# Patient Record
Sex: Male | Born: 1955 | Race: Black or African American | Hispanic: No | Marital: Married | Smoking: Current every day smoker
Health system: Southern US, Community
[De-identification: ages and names within clinical notes are randomized; demographics above are authoritative.]

## PROBLEM LIST (undated history)

## (undated) DIAGNOSIS — R6884 Jaw pain: Secondary | ICD-10-CM

---

## 1998-06-13 ENCOUNTER — Emergency Department (HOSPITAL_COMMUNITY): Admission: EM | Admit: 1998-06-13 | Discharge: 1998-06-13 | Payer: Self-pay | Admitting: Emergency Medicine

## 1998-09-03 ENCOUNTER — Emergency Department (HOSPITAL_COMMUNITY): Admission: EM | Admit: 1998-09-03 | Discharge: 1998-09-03 | Payer: Self-pay | Admitting: Emergency Medicine

## 1999-07-04 ENCOUNTER — Encounter: Payer: Self-pay | Admitting: Emergency Medicine

## 1999-07-04 ENCOUNTER — Emergency Department (HOSPITAL_COMMUNITY): Admission: EM | Admit: 1999-07-04 | Discharge: 1999-07-04 | Payer: Self-pay | Admitting: Emergency Medicine

## 2000-09-09 ENCOUNTER — Emergency Department (HOSPITAL_COMMUNITY): Admission: EM | Admit: 2000-09-09 | Discharge: 2000-09-09 | Payer: Self-pay | Admitting: Emergency Medicine

## 2000-11-13 ENCOUNTER — Emergency Department (HOSPITAL_COMMUNITY): Admission: EM | Admit: 2000-11-13 | Discharge: 2000-11-13 | Payer: Self-pay | Admitting: Emergency Medicine

## 2004-10-13 ENCOUNTER — Inpatient Hospital Stay (HOSPITAL_COMMUNITY): Admission: AC | Admit: 2004-10-13 | Discharge: 2004-10-20 | Payer: Self-pay | Admitting: Emergency Medicine

## 2010-04-22 ENCOUNTER — Emergency Department (HOSPITAL_COMMUNITY): Admission: EM | Admit: 2010-04-22 | Discharge: 2010-04-22 | Payer: Self-pay | Admitting: Emergency Medicine

## 2010-04-27 ENCOUNTER — Emergency Department (HOSPITAL_COMMUNITY): Admission: EM | Admit: 2010-04-27 | Discharge: 2010-04-28 | Payer: Self-pay | Admitting: Emergency Medicine

## 2010-09-12 ENCOUNTER — Emergency Department (HOSPITAL_COMMUNITY)
Admission: EM | Admit: 2010-09-12 | Discharge: 2010-09-12 | Payer: Self-pay | Source: Home / Self Care | Admitting: Emergency Medicine
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
## 2012-01-09 ENCOUNTER — Emergency Department (HOSPITAL_COMMUNITY)
Admission: EM | Admit: 2012-01-09 | Discharge: 2012-01-09 | Disposition: A | Discharge status: Home / Self Care | Payer: Self-pay | Attending: Emergency Medicine | Admitting: Emergency Medicine

## 2012-01-09 ENCOUNTER — Encounter (HOSPITAL_COMMUNITY): Payer: Self-pay | Admitting: Emergency Medicine

## 2012-01-09 DIAGNOSIS — K0889 Other specified disorders of teeth and supporting structures: Secondary | ICD-10-CM

## 2012-01-09 DIAGNOSIS — K029 Dental caries, unspecified: Secondary | ICD-10-CM | POA: Insufficient documentation

## 2012-01-09 DIAGNOSIS — F17200 Nicotine dependence, unspecified, uncomplicated: Secondary | ICD-10-CM | POA: Insufficient documentation

## 2012-01-09 MED ORDER — OXYCODONE-ACETAMINOPHEN 5-325 MG PO TABS
1.0000 | ORAL_TABLET | Freq: Once | ORAL | Status: AC
  Administered 2012-01-09: 1 via ORAL
  Filled 2012-01-09: qty 1

## 2012-01-09 MED ORDER — BUPIVACAINE-EPINEPHRINE PF 0.5-1:200000 % IJ SOLN
1.8000 mL | Freq: Once | INTRAMUSCULAR | Status: DC
  Filled 2012-01-09: qty 1.8

## 2012-01-09 MED ORDER — LIDOCAINE HCL 2 % IJ SOLN
20.0000 mL | Freq: Once | INTRAMUSCULAR | Status: DC
  Filled 2012-01-09: qty 1

## 2012-01-09 MED ORDER — PENICILLIN V POTASSIUM 500 MG PO TABS: 500.0000 mg | ORAL_TABLET | Freq: Three times a day (TID) | ORAL | Status: AC

## 2012-01-09 MED ORDER — HYDROCODONE-ACETAMINOPHEN 5-325 MG PO TABS: ORAL_TABLET | ORAL | Status: AC

## 2012-01-09 NOTE — ED Provider Notes (Signed)
History     CSN: [id number]  Arrival date & time 01/09/12  1307   First MD Initiated Contact with Patient 01/09/12 1324      Chief Complaint  Patient presents with  . Dental Pain    3 day hx of pain on l/side of face,    (Consider location/radiation/quality/duration/timing/severity/associated sxs/prior treatment) HPI Comments: Patient with L maxillary tooth pain with radiation to L ear x 3 days. No fever, facial swelling, SOB. Tried anbesol without relief. History of poor dentition with tooth pain. Nothing makes pain better. Palpation makes it worse.   Patient is a 56 y.o. male presenting with tooth pain. The history is provided by the patient.  Dental PainThe primary symptoms include mouth pain and headaches. Primary symptoms do not include dental injury, fever, shortness of breath or sore throat. The symptoms began 3 to 5 days ago. The symptoms are unchanged.  Additional symptoms include: ear pain. Additional symptoms do not include: gum swelling, gum tenderness, facial swelling and trouble swallowing.    History reviewed. No pertinent past medical history.  History reviewed. No pertinent past surgical history.  History reviewed. No pertinent family history.  History  Substance Use Topics  . Smoking status: Current Everyday Smoker  . Smokeless tobacco: Not on file  . Alcohol Use: No      Review of Systems  Constitutional: Negative for fever.  HENT: Positive for ear pain and dental problem. Negative for sore throat, facial swelling, trouble swallowing and neck pain.   Respiratory: Negative for shortness of breath and stridor.   Skin: Negative for color change.  Neurological: Positive for headaches.    Allergies  Review of patient's allergies indicates no known allergies.  Home Medications  No current outpatient prescriptions on file.  BP 115/68  Pulse 66  Temp(Src) 99 F (37.2 C) (Oral)  Resp 16  SpO2 99%  Physical Exam  Nursing note and vitals  reviewed. Constitutional: He is oriented to person, place, and time. He appears well-developed and well-nourished.  HENT:  Head: Normocephalic and atraumatic. No trismus in the jaw.  Right Ear: Tympanic membrane, external ear and ear canal normal.  Left Ear: Tympanic membrane, external ear and ear canal normal.  Nose: Nose normal.  Mouth/Throat: Uvula is midline, oropharynx is clear and moist and mucous membranes are normal. Abnormal dentition. Dental caries present. No dental abscesses or uvula swelling. No tonsillar abscesses.       Patient with L maxillary tooth pain and tenderness to palpation in area of first bicuspid. No swelling or erythema noted on exam.  Eyes: Pupils are equal, round, and reactive to light.  Neck: Normal range of motion. Neck supple.       No neck swelling or Lugwig's angina  Neurological: He is alert and oriented to person, place, and time.  Skin: Skin is warm and dry.  Psychiatric: He has a normal mood and affect.    ED Course  Procedures (including critical care time)  Labs Reviewed - No data to display No results found.   1. Pain, dental     2:06 PM Patient seen and examined. Will dental block.  Vital signs reviewed and are as follows: Filed Vitals:   01/09/12 1340  BP: 115/68  Pulse: 66  Temp: 99 F (37.2 C)  Resp: 16   Dental block was performed. 1.53mL of 2% lidocaine with epi was combined with 1.25mL 0.5% bupivacaine with epi and an alveolar block was performed. Injections made at base of tooth as  well as the tooth immediately posterior and anterior to tooth. Adequate anesthesia was obtained with residual referred pain to posterior jaw. Minimal bleeding after injections. Patient tolerated procedure well with no immediate complications.   Patient counseled to take prescribed medications as directed, return with worsening facial or neck swelling, and to follow-up with his dentist as soon as possible.   Patient counseled on use of narcotic pain  medications. Counseled not to combine these medications with others containing tylenol. Urged not to drink alcohol, drive, or perform any other activities that requires focus while taking these medications. The patient verbalizes understanding and agrees with the plan.   MDM  Patient with toothache.  No gross abscess.  Exam unconcerning for Ludwig's angina or other deep tissue infection in neck.  Will treat with penicillin and pain medicine.  Urged patient to follow-up with dentist.         Renne Crigler, PA 01/09/12 1550

## 2012-01-09 NOTE — ED Notes (Signed)
toth pain on l/side of face x 3 days

## 2012-01-09 NOTE — Discharge Instructions (Signed)
Please read and follow all provided instructions.  Your diagnoses today include:  1. Pain, dental     Tests performed today include:  Vital signs. See below for your results today.   Medications prescribed:   Vicodin (hydrocodone/acetaminophen) - narcotic pain medication  You have been prescribed narcotic pain medication such as Vicodin, Percocet, or Ultram: DO NOT drive or perform any activities that require you to be awake and alert because this medicine can make you drowsy. BE VERY CAREFUL not to take multiple medicines containing Tylenol (also called acetaminophen). Doing so can lead to an overdose which can damage your liver and cause liver failure and possibly death.    Naproxen - anti-inflammatory pain medication  Do not exceed 500mg  naproxen every 12 hours  You have been prescribed an anti-inflammatory medication or NSAID. Take with food. Take smallest effective dose for the shortest duration needed for your pain. Stop taking if you experience stomach pain or vomiting.    Penicillin - antibiotic for dental infection  You have been prescribed an antibiotic medicine: take the entire course of medicine even if you are feeling better. Stopping early can cause the antibiotic not to work.  Take any prescribed medications only as directed.  Home care instructions:  Follow any educational materials contained in this packet.  Follow-up instructions: Please follow-up with your dentist for further evaluation of your symptoms. If you do not have a dentist or primary care doctor -- see below for referral information.   The exam and treatment you received today has been provided on an emergency basis only. This is not a substitute for complete medical or dental care. If your problem worsens or new symptoms (problems) appear, and you are unable to arrange prompt follow-up care with your dentist, return to this location.  Return instructions:   Please return to the Emergency  Department if you experience worsening symptoms.  Please return if you develop a fever, you develop more swelling in your face or neck, you have trouble breathing or swallowing food.  Please return if you have any other emergent concerns.  Additional Information:  Your vital signs today were: BP 115/68  Pulse 66  Temp(Src) 99 F (37.2 C) (Oral)  Resp 16  SpO2 99% If your blood pressure (BP) was elevated above 135/85 this visit, please have this repeated by your doctor within one month. -------------- No Primary Care Doctor Call [facility]  [phone] Other agencies that provide inexpensive medical care    [facility]  [phone]    [facility]  [phone]    [facility]  [phone]    [facility]  [phone]    [facility]  [phone]    [facility]  [phone] -------------- RESOURCE GUIDE:  Dental Problems  Patients with Medicaid: [facility] [phone]                                            [phone] [address] Phone:  [phone]  Phone:  [phone]  If unable to pay or uninsured, contact:  [facility] or [facility]. to become qualified for the adult dental clinic.  Chronic Pain Problems Contact [facility]  [phone] Patients need to be referred by their primary care doctor.  Insufficient Money for Medicine Contact United Way:  call "211" or [facility] [phone].  Psychological Services [facility] Behavioral Health  [phone] [facility]  [phone] [facility] Mental Health   [phone] (emergency services [phone])  Substance Abuse Resources [facility] and Drug Services  [phone] [facility] [phone] [facility] [place] [phone] Floydene Flock [phone] Residential & Outpatient Substance Abuse Program   [phone]  Abuse/Neglect [facility] Child Abuse Hotline 878-075-1589 [facility] Child Abuse Hotline (501)284-9788 (After Hours)  Emergency Shelter [facility] [phone]  Maternity Homes Room at the [place] [facility] [place] [phone] [place] Services [phone]  [facility] Resources  [facility] [place]     [employer]                          [facility] [address] [place]                       [address]      [address]  Blondell Reveal Phone:  [phone]                                   Phone:  [phone]                 Phone:  [phone]  [facility] Mental Health Phone:  [phone]  [facility] Child Abuse Hotline 228-583-7065 [phone] (After Hours)

## 2012-01-09 NOTE — ED Provider Notes (Signed)
Medical screening examination/treatment/procedure(s) were performed by non-physician practitioner and as supervising physician I was immediately available for consultation/collaboration.    Celene Kras, MD 01/09/12 [phone]

## 2013-10-15 ENCOUNTER — Emergency Department (HOSPITAL_COMMUNITY)
Admission: EM | Admit: 2013-10-15 | Discharge: 2013-10-15 | Disposition: A | Discharge status: Home / Self Care | Payer: Self-pay | Attending: Emergency Medicine | Admitting: Emergency Medicine

## 2013-10-15 ENCOUNTER — Emergency Department (HOSPITAL_COMMUNITY): Payer: Self-pay

## 2013-10-15 ENCOUNTER — Encounter (HOSPITAL_COMMUNITY): Payer: Self-pay | Admitting: Emergency Medicine

## 2013-10-15 DIAGNOSIS — F17200 Nicotine dependence, unspecified, uncomplicated: Secondary | ICD-10-CM | POA: Insufficient documentation

## 2013-10-15 DIAGNOSIS — K089 Disorder of teeth and supporting structures, unspecified: Secondary | ICD-10-CM | POA: Insufficient documentation

## 2013-10-15 DIAGNOSIS — K0889 Other specified disorders of teeth and supporting structures: Secondary | ICD-10-CM

## 2013-10-15 MED ORDER — AMOXICILLIN-POT CLAVULANATE 875-125 MG PO TABS: 1.0000 | ORAL_TABLET | Freq: Two times a day (BID) | ORAL | Status: DC

## 2013-10-15 MED ORDER — OXYCODONE-ACETAMINOPHEN 10-325 MG PO TABS
0.5000 | ORAL_TABLET | ORAL | Status: AC | PRN
Start: 2013-10-15 — End: ?

## 2013-10-15 NOTE — ED Provider Notes (Signed)
CSN: [id number]     Arrival date & time 10/15/13  1001 History   First MD Initiated Contact with Patient 10/15/13 1029     Chief Complaint  Patient presents with  . Dental Pain   (Consider location/radiation/quality/duration/timing/severity/associated sxs/prior Treatment) Patient is a 58 y.o. male presenting with tooth pain. The history is provided by the patient and medical records. No language interpreter was used.  Dental Pain Location:  Upper Upper teeth location:  10/LU lateral incisor and 11/LU cuspid Quality:  Throbbing, pulsating, pressure-like, constant and aching Severity:  Severe Duration: acutely worsening last night. Timing:  Constant Progression:  Worsening Chronicity:  Recurrent Context: crown fracture and dental caries   Relieved by:  Nothing Worsened by:  Cold food/drink, hot food/drink and jaw movement Ineffective treatments: amoxicillin/ percocet/ hydrocodone. Associated symptoms: facial pain and gum swelling   Associated symptoms: no difficulty swallowing, no drooling, no facial swelling, no fever, no headaches and no neck pain   Risk factors: smoking     History reviewed. No pertinent past medical history. No past surgical history on file. No family history on file. History  Substance Use Topics  . Smoking status: Current Every Day Smoker  . Smokeless tobacco: Not on file  . Alcohol Use: No    Review of Systems  Constitutional: Negative for fever.  HENT: Negative for drooling and facial swelling.   Musculoskeletal: Negative for neck pain.  Neurological: Negative for headaches.    Allergies  Review of patient's allergies indicates no known allergies.  Home Medications   Current Outpatient Rx  Name  Route  Sig  Dispense  Refill  . amoxicillin (AMOXIL) 500 MG capsule   Oral   Take 500 mg by mouth 3 (three) times daily. 10 day supply started on 10/07/2013         . benzocaine (ORAJEL) 10 % mucosal gel   Mouth/Throat   Use as directed 1  application in the mouth or throat 3 (three) times daily as needed for mouth pain.         Marland Kitchen. HYDROcodone-acetaminophen (NORCO/VICODIN) 5-325 MG per tablet   Oral   Take 1 tablet by mouth every 6 (six) hours as needed for moderate pain.         Marland Kitchen. oxyCODONE-acetaminophen (PERCOCET/ROXICET) 5-325 MG per tablet   Oral   Take 1 tablet by mouth every 4 (four) hours as needed for moderate pain or severe pain.          BP 119/72  Pulse 66  Temp(Src) 97.5 F (36.4 C) (Oral)  Resp 17  SpO2 99% Physical Exam  Nursing note and vitals reviewed. Constitutional: He appears well-developed and well-nourished. No distress.  HENT:  Head: Normocephalic and atraumatic.  Mouth/Throat:    Eyes: Conjunctivae are normal. No scleral icterus.  Neck: Normal range of motion. Neck supple.  Cardiovascular: Normal rate, regular rhythm and normal heart sounds.   Pulmonary/Chest: Effort normal and breath sounds normal. No respiratory distress.  Abdominal: Soft. There is no tenderness.  Musculoskeletal: He exhibits no edema.  Neurological: He is alert.  Skin: Skin is warm and dry. He is not diaphoretic.  Psychiatric: His behavior is normal.    ED Course  Procedures (including critical care time) Labs Review Labs Reviewed - No data to display Imaging Review Dg Orthopantogram  10/15/2013   CLINICAL DATA:  Left mandible pain, history of left upper molar extraction  EXAM: ORTHOPANTOGRAM/PANORAMIC  COMPARISON:  Cervical spine radiographs 09/12/2010  FINDINGS: Lucent defect in the left  maxilla likely represents residual cavity from recent molar extraction as described. There are multiple missing and carious teeth. The mandible appears intact.  IMPRESSION: Lucent defect in the left maxilla, likely at the site of recent molar extraction. If there is clinical concern for odontogenic abscess, recommend CT scan of the face with contrast.   Electronically Signed   By: Malachy Moan M.D.   On: 10/15/2013 11:16      EKG Interpretation   None       MDM   1. Pain, dental    Patient with recetn dental extractions  Pain worsening since last night. Will switch to augmentin  Patient with toothache.  No gross abscess.  Exam unconcerning for Ludwig's angina or spread of infection.  Will treat with penicillin and pain medicine.  Urged patient to follow-up with dentist.       Arthor Captain, PA-C 10/16/13 [phone]

## 2013-10-15 NOTE — ED Notes (Signed)
Pt c/o dental pain.  States that he had 2 teeth pulled and it still hurts.  C/o pain x 2 wks.

## 2013-10-15 NOTE — Discharge Instructions (Signed)
You have a dental pain. Use the resource guide listed below to help you find a dentist if you do not already have one to followup with. It is very important that you get evaluated by a dentist as soon as possible. Call tomorrow to schedule an appointment. Use your pain medication as prescribed and do not operate heavy machinery while on pain medication. Note that your pain medication contains acetaminophen (Tylenol) & its is not reccommended that you use additional acetaminophen (Tylenol) while taking this medication. Take your full course of antibiotics. Read the instructions below.  Eat a soft or liquid diet and rinse your mouth out after meals with warm water. You should see a dentist or return here at once if you have increased swelling, increased pain or uncontrolled bleeding from the site of your injury.   SEEK MEDICAL CARE IF:   You have increased pain not controlled with medicines.   You have swelling around your tooth, in your face or neck.   You have bleeding which starts, continues, or gets worse.   You have a fever >101  If you are unable to open your mouth  RESOURCE GUIDE  Dental Problems  Patients with Medicaid: [facility]                     [facility] [id number]                                           [phone] Phone:  [phone]                                                  Phone:  [phone]  If unable to pay or uninsured, contact:  [facility] or [facility] to become qualified for the adult dental clinic.  Chronic Pain Problems Contact [facility]  [phone] Patients need to be referred by their primary care doctor.  Insufficient Money for Medicine Contact United Way:  call "211" or [facility] [phone].  No Primary Care Doctor Call [facility]  [phone] Other agencies that provide inexpensive medical care    [facility]  [phone]    [facility]  Internal Medicine  [phone]    [facility]  [phone]    [facility]  [phone]    [facility]  [phone]    [facility]  [phone]  Psychological Services [facility]  [phone] [facility]  [phone] [facility]   [phone] (emergency services [phone])  Substance Abuse Resources Alcohol and Drug Services  [phone] [facility] [phone] [facility] [phone] [facility] [phone] Residential & Outpatient Substance Abuse Program  [phone]  Abuse/Neglect [facility] Child Abuse Hotline 8137032846(336) [phone] [facility] Hotline (862)439-0947([phone] (After Hours)  Emergency Shelter [facility] [phone]  Maternity Homes Room at the [place] [facility] [place] [phone] [facility] [phone]  MRSA Hotline #:   502-538-41835146503379    [facility] Resources  [facility] [place]     United Way  [facility] °[address] [place]                       [address]      [address]  °[place]                                                [place]                            [place] °Phone:  [phone]                                   Phone:  [phone]                 Phone:  [phone] ° °[facility] °Phone:  342-8316 ° °[place] Child Abuse Hotline °(336) 342-1394 °([phone]) [phone] (After Hours) ° ° ° ° °

## 2013-10-16 NOTE — ED Provider Notes (Signed)
Medical screening examination/treatment/procedure(s) were performed by non-physician practitioner and as supervising physician I was immediately available for consultation/collaboration.  EKG Interpretation   None         Jaidy Cottam, MD 10/16/13 2133 

## 2015-06-18 ENCOUNTER — Emergency Department (HOSPITAL_COMMUNITY)
Admission: EM | Admit: 2015-06-18 | Discharge: 2015-06-18 | Disposition: A | Discharge status: Home / Self Care | Payer: No Typology Code available for payment source | Attending: Emergency Medicine | Admitting: Emergency Medicine

## 2015-06-18 ENCOUNTER — Encounter (HOSPITAL_COMMUNITY): Payer: Self-pay | Admitting: Emergency Medicine

## 2015-06-18 DIAGNOSIS — K088 Other specified disorders of teeth and supporting structures: Secondary | ICD-10-CM | POA: Diagnosis not present

## 2015-06-18 DIAGNOSIS — Z720 Tobacco use: Secondary | ICD-10-CM | POA: Insufficient documentation

## 2015-06-18 DIAGNOSIS — K0889 Other specified disorders of teeth and supporting structures: Secondary | ICD-10-CM

## 2015-06-18 DIAGNOSIS — R220 Localized swelling, mass and lump, head: Secondary | ICD-10-CM | POA: Diagnosis present

## 2015-06-18 MED ORDER — IBUPROFEN 600 MG PO TABS: 600.0000 mg | ORAL_TABLET | Freq: Four times a day (QID) | ORAL | Status: DC | PRN

## 2015-06-18 MED ORDER — BUPIVACAINE-EPINEPHRINE (PF) 0.5% -1:200000 IJ SOLN
1.8000 mL | Freq: Once | INTRAMUSCULAR | Status: AC
  Administered 2015-06-18: 1.8 mL
  Filled 2015-06-18: qty 1.8

## 2015-06-18 MED ORDER — PENICILLIN V POTASSIUM 500 MG PO TABS: 500.0000 mg | ORAL_TABLET | Freq: Four times a day (QID) | ORAL | Status: AC

## 2015-06-18 NOTE — ED Notes (Signed)
Pt reports pain, tenderness and swelling in r/lower gum line. Pain started 3 days ago. Swelling started yesterday.

## 2015-06-18 NOTE — ED Provider Notes (Signed)
CSN: [id number]     Arrival date & time 06/18/15  1150 History  This chart was scribed for non-physician practitioner, Joycie Peek, PA-C working with Linwood Dibbles, MD by Placido Sou, ED scribe. This patient was seen in room WTR9/WTR9 and the patient's care was started at 12:38 PM.   Chief Complaint  Patient presents with  . Oral Swelling    pain in r/lower gum line x 3 days. Swelling x 1 day   The history is provided by the patient. No language interpreter was used.    HPI Comments: Logan Payne is a 59 y.o. male who presents to the Emergency Department complaining of constant, moderate, right lower dental pain and swelling with onset 2 days ago. Pt notes having a loose tooth in the affected region and describes his pain as an 8/10 throbbing sensation. He notes taking 1x aspirin last night which provided no relief. Pt denies having any dental coverage or a primary dental provider. He denies any trismus, fevers, chills, trouble swallowing, abd pain or sore throat.   History reviewed. No pertinent past medical history. History reviewed. No pertinent past surgical history. Family History  Problem Relation Age of Onset  . Diabetes Mother    Social History  Substance Use Topics  . Smoking status: Current Every Day Smoker    Types: Cigarettes  . Smokeless tobacco: None  . Alcohol Use: Yes    Review of Systems  Constitutional: Negative for fever and chills.  HENT: Positive for dental problem and facial swelling. Negative for sore throat and trouble swallowing.   Gastrointestinal: Negative for abdominal pain.  All other systems reviewed and are negative.  Allergies  Review of patient's allergies indicates no known allergies.  Home Medications   Prior to Admission medications   Medication Sig Start Date End Date Taking? Authorizing Provider  amoxicillin (AMOXIL) 500 MG capsule Take 500 mg by mouth 3 (three) times daily. 10 day supply started on 10/07/2013    Historical Provider,  MD  amoxicillin-clavulanate (AUGMENTIN) 875-125 MG per tablet Take 1 tablet by mouth 2 (two) times daily. One po bid x 7 days 10/15/13   Arthor Captain, PA-C  benzocaine (ORAJEL) 10 % mucosal gel Use as directed 1 application in the mouth or throat 3 (three) times daily as needed for mouth pain.    Historical Provider, MD  HYDROcodone-acetaminophen (NORCO/VICODIN) 5-325 MG per tablet Take 1 tablet by mouth every 6 (six) hours as needed for moderate pain.    Historical Provider, MD  oxyCODONE-acetaminophen (PERCOCET) 10-325 MG per tablet Take 0.5-1 tablets by mouth every 4 (four) hours as needed for pain. 10/15/13   Arthor Captain, PA-C  oxyCODONE-acetaminophen (PERCOCET/ROXICET) 5-325 MG per tablet Take 1 tablet by mouth every 4 (four) hours as needed for moderate pain or severe pain.    Historical Provider, MD   BP 105/67 mmHg  Pulse 61  Temp(Src) 98.6 F (37 C) (Oral)  Resp 18  Wt 150 lb (68.04 kg)  SpO2 99% Physical Exam  Constitutional: He is oriented to person, place, and time. He appears well-developed and well-nourished.  African-American male in no apparent distress.  HENT:  Head: Normocephalic and atraumatic.  Mouth/Throat: No oropharyngeal exudate.  Dental pain located to right mandibular bicuspid. Tooth is mildly loose but in the socket. No gingival swelling, drainage, erythema, bleeding. Oropharynx is moist with some black discoloration. No unilateral tonsillar swelling, glossal elevation or trismus.  Neck: Normal range of motion. No tracheal deviation present.  Cardiovascular: Normal rate.  Pulmonary/Chest: Effort normal. No respiratory distress.  Abdominal: Soft. There is no tenderness.  Musculoskeletal: Normal range of motion.  Neurological: He is alert and oriented to person, place, and time.  Skin: Skin is warm and dry. He is not diaphoretic.  Psychiatric: He has a normal mood and affect. His behavior is normal.  Nursing note and vitals reviewed.   ED Course   Procedures  NERVE BLOCK Performed by: Sharlene Motts Consent: Verbal consent obtained. Required items: required blood products, implants, devices, and special equipment available Time out: Immediately prior to procedure a "time out" was called to verify the correct patient, procedure, equipment, support staff and site/side marked as required.  Indication: dental pain Nerve block body site: inf alveolar  Preparation: Patient was prepped and draped in the usual sterile fashion. Needle gauge: 24 G Location technique: anatomical landmarks  Local anesthetic: bupivacaine  Anesthetic total: 1.8 ml  Outcome: pain improved Patient tolerance: Patient tolerated the procedure well with no immediate complications.  DIAGNOSTIC STUDIES: Oxygen Saturation is 99% on RA, normal by my interpretation.    COORDINATION OF CARE: 12:41 PM Discussed treatment plan with pt at bedside including a dental block, an rx for anti-inflammatories and a referral for a dental provider. Pt agreed to plan.  Labs Review Labs Reviewed - No data to display  Imaging Review No results found. I have personally reviewed and evaluated these images and lab results as part of my medical decision-making.   EKG Interpretation None      Filed Vitals:   06/18/15 1214 06/18/15 1344  BP: 105/67   Pulse: 61 68  Temp: 98.6 F (37 C)   TempSrc: Oral   Resp: 18   Weight: 150 lb (68.04 kg)   SpO2: 99% 99%   Meds given in ED:  Medications  bupivacaine-epinephrine (MARCAINE W/ EPI) 0.5% -1:200000 injection 1.8 mL (1.8 mLs Infiltration Given 06/18/15 1311)    Discharge Medication List as of 06/18/2015  1:31 PM    START taking these medications   Details  ibuprofen (ADVIL,MOTRIN) 600 MG tablet Take 1 tablet (600 mg total) by mouth every 6 (six) hours as needed., Starting 06/18/2015, Until Discontinued, Print    penicillin v potassium (VEETID) 500 MG tablet Take 1 tablet (500 mg total) by mouth 4 (four) times  daily., Starting 06/18/2015, Until Mon 06/25/15, Print          PT presents with Dental pain. No evidence of Ludwigs, PTA or other acute intraoral infx. Tolerating PO. VSS and nontoxic. Loose tooth in R mandibular bicuspid. Given outpt dental resources, abx. Pt verbalizes understanding. Appropriate for discharge. Final diagnoses:  Pain, dental  Loosening of tooth      I personally performed the services described in this documentation, which was scribed in my presence. The recorded information has been reviewed and is accurate.    Joycie Peek, PA-C 06/19/15 1111  Linwood Dibbles, MD 06/21/15 [phone]

## 2015-06-18 NOTE — Discharge Instructions (Signed)
Physical reviewed follow-up with dentistry for definitive care. Please take your anti-inflammatory's as prescribed for discomfort. Take your antibiotics as prescribed. Return to ED for worsening symptoms.  Dental Pain A tooth ache may be caused by cavities (tooth decay). Cavities expose the nerve of the tooth to air and hot or cold temperatures. It may come from an infection or abscess (also called a boil or furuncle) around your tooth. It is also often caused by dental caries (tooth decay). This causes the pain you are having. DIAGNOSIS  Your caregiver can diagnose this problem by exam. TREATMENT   If caused by an infection, it may be treated with medications which kill germs (antibiotics) and pain medications as prescribed by your caregiver. Take medications as directed.  Only take over-the-counter or prescription medicines for pain, discomfort, or fever as directed by your caregiver.  Whether the tooth ache today is caused by infection or dental disease, you should see your dentist as soon as possible for further care. SEEK MEDICAL CARE IF: The exam and treatment you received today has been provided on an emergency basis only. This is not a substitute for complete medical or dental care. If your problem worsens or new problems (symptoms) appear, and you are unable to meet with your dentist, call or return to this location. SEEK IMMEDIATE MEDICAL CARE IF:   You have a fever.  You develop redness and swelling of your face, jaw, or neck.  You are unable to open your mouth.  You have severe pain uncontrolled by pain medicine. MAKE SURE YOU:   Understand these instructions.  Will watch your condition.  Will get help right away if you are not doing well or get worse. Document Released: 09/08/2005 Document Revised: 12/01/2011 Document Reviewed: 04/26/2008 [facility] Patient Information 2015 [place], [place]. This information is not intended to replace advice given to you by your health care  provider. Make sure you discuss any questions you have with your health care provider.  Dental Care and Dentist Visits Dental care supports good overall health. Regular dental visits can also help you avoid dental pain, bleeding, infection, and other more serious health problems in the future. It is important to keep the mouth healthy because diseases in the teeth, gums, and other oral tissues can spread to other areas of the body. Some problems, such as diabetes, heart disease, and pre-term labor have been associated with poor oral health.  See your dentist every 6 months. If you experience emergency problems such as a toothache or broken tooth, go to the dentist right away. If you see your dentist regularly, you may catch problems early. It is easier to be treated for problems in the early stages.  WHAT TO EXPECT AT A DENTIST VISIT  Your dentist will look for many common oral health problems and recommend proper treatment. At your regular dental visit, you can expect:  Gentle cleaning of the teeth and gums. This includes scraping and polishing. This helps to remove the sticky substance around the teeth and gums (plaque). Plaque forms in the mouth shortly after eating. Over time, plaque hardens on the teeth as tartar. If tartar is not removed regularly, it can cause problems. Cleaning also helps remove stains.  Periodic X-rays. These pictures of the teeth and supporting bone will help your dentist assess the health of your teeth.  Periodic fluoride treatments. Fluoride is a natural mineral shown to help strengthen teeth. Fluoride treatmentinvolves applying a fluoride gel or varnish to the teeth. It is most commonly  done in children.  Examination of the mouth, tongue, jaws, teeth, and gums to look for any oral health problems, such as:  Cavities (dental caries). This is decay on the tooth caused by plaque, sugar, and acid in the mouth. It is best to catch a cavity when it is small.  Inflammation  of the gums caused by plaque buildup (gingivitis).  Problems with the mouth or malformed or misaligned teeth.  Oral cancer or other diseases of the soft tissues or jaws. KEEP YOUR TEETH AND GUMS HEALTHY For healthy teeth and gums, follow these general guidelines as well as your dentist's specific advice:  Have your teeth professionally cleaned at the dentist every 6 months.  Brush twice daily with a fluoride toothpaste.  Floss your teeth daily.  Ask your dentist if you need fluoride supplements, treatments, or fluoride toothpaste.  Eat a healthy diet. Reduce foods and drinks with added sugar.  Avoid smoking. TREATMENT FOR ORAL HEALTH PROBLEMS If you have oral health problems, treatment varies depending on the conditions present in your teeth and gums.  Your caregiver will most likely recommend good oral hygiene at each visit.  For cavities, gingivitis, or other oral health disease, your caregiver will perform a procedure to treat the problem. This is typically done at a separate appointment. Sometimes your caregiver will refer you to another dental specialist for specific tooth problems or for surgery. SEEK IMMEDIATE DENTAL CARE IF:  You have pain, bleeding, or soreness in the gum, tooth, jaw, or mouth area.  A permanent tooth becomes loose or separated from the gum socket.  You experience a blow or injury to the mouth or jaw area. Document Released: 05/21/2011 Document Revised: 12/01/2011 Document Reviewed: 05/21/2011 [facility] Patient Information 2015 [place], [place]. This information is not intended to replace advice given to you by your health care provider. Make sure you discuss any questions you have with your health care provider.

## 2015-07-28 ENCOUNTER — Emergency Department (HOSPITAL_COMMUNITY)
Admission: EM | Admit: 2015-07-28 | Discharge: 2015-07-28 | Disposition: A | Discharge status: Home / Self Care | Payer: PRIVATE HEALTH INSURANCE | Attending: Emergency Medicine | Admitting: Emergency Medicine

## 2015-07-28 ENCOUNTER — Encounter (HOSPITAL_COMMUNITY): Payer: Self-pay | Admitting: Emergency Medicine

## 2015-07-28 DIAGNOSIS — Z720 Tobacco use: Secondary | ICD-10-CM | POA: Insufficient documentation

## 2015-07-28 DIAGNOSIS — K029 Dental caries, unspecified: Secondary | ICD-10-CM | POA: Insufficient documentation

## 2015-07-28 DIAGNOSIS — Z792 Long term (current) use of antibiotics: Secondary | ICD-10-CM | POA: Insufficient documentation

## 2015-07-28 DIAGNOSIS — K0889 Other specified disorders of teeth and supporting structures: Secondary | ICD-10-CM

## 2015-07-28 HISTORY — DX: Jaw pain: R68.84

## 2015-07-28 MED ORDER — HYDROCODONE-ACETAMINOPHEN 5-325 MG PO TABS
2.0000 | ORAL_TABLET | Freq: Once | ORAL | Status: AC
  Administered 2015-07-28: 2 via ORAL
  Filled 2015-07-28: qty 2

## 2015-07-28 MED ORDER — HYDROCODONE-ACETAMINOPHEN 5-325 MG PO TABS: 2.0000 | ORAL_TABLET | ORAL | Status: AC | PRN

## 2015-07-28 MED ORDER — AMOXICILLIN-POT CLAVULANATE 875-125 MG PO TABS: 1.0000 | ORAL_TABLET | Freq: Two times a day (BID) | ORAL | Status: AC

## 2015-07-28 NOTE — Discharge Instructions (Signed)
1. Medications: augmentin, vicodin, usual home medications 2. Treatment: rest, drink plenty of fluids 3. Follow Up: please followup with your dentist in 2-3 days for discussion of your diagnoses and further evaluation after today's visit; please return to the ER for high fever, severe pain, increased swelling, difficulty swallowing, new or worsening symptoms   Emergency Department Resource Guide 1) Find a Doctor and Pay Out of Pocket Although you won't have to find out who is covered by your insurance plan, it is a good idea to ask around and get recommendations. You will then need to call the office and see if the doctor you have chosen will accept you as a new patient and what types of options they offer for patients who are self-pay. Some doctors offer discounts or will set up payment plans for their patients who do not have insurance, but you will need to ask so you aren't surprised when you get to your appointment.  2) Contact Your Local Health Department Not all health departments have doctors that can see patients for sick visits, but many do, so it is worth a call to see if yours does. If you don't know where your local health department is, you can check in your phone book. The CDC also has a tool to help you locate your state's health department, and many state websites also have listings of all of their local health departments.  3) Find a Walk-in Clinic If your illness is not likely to be very severe or complicated, you may want to try a walk in clinic. These are popping up all over the country in pharmacies, drugstores, and shopping centers. They're usually staffed by nurse practitioners or physician assistants that have been trained to treat common illnesses and complaints. They're usually fairly quick and inexpensive. However, if you have serious medical issues or chronic medical problems, these are probably not your best option.  No Primary Care Doctor: - Call Health Connect at   [phone] - they can help you locate a primary care doctor that  accepts your insurance, provides certain services, etc. - Physician Referral Service- [phone]  Chronic Pain Problems: Organization         Address  Phone   Notes  [facility]  [phone] Patients need to be referred by their primary care doctor.   Medication Assistance: Organization         Address  Phone   Notes  [facility] Medication [facility] [address] [place] [place], [place] [address] [phone] --Must be a resident of [facility] -- Must have NO insurance coverage whatsoever (no Medicaid/ Medicare, etc.) -- The pt. MUST have a primary care doctor that directs their care regularly and follows them in the community   [facility]  [phone]   [employer]  [phone]    Agencies that provide inexpensive medical care: Organization         Address  Phone   Notes  [facility]  [phone]   [facility]    [phone]   [facility] [address] [place], [place] [address] [phone]   [facility] [place] [address] [place] [place] [phone]   Planned Parenthood    [phone]   [facility]    [phone]   [facility] and [facility]  [address], [place] Phone:  [phone], Fax:  [phone] Hours  of Operation:  9 am - 6 pm, M-F.  Also accepts Medicaid/Medicare and self-pay.  [facility]  [address], [place] Phone: [phone]) [phone], Fax: [phone]) [phone]. Hours of Operation:  8:30 am - 5:30 pm, M-F.  Also accepts Medicaid and self-pay.  [facility] [place] [address], [place] Phone: [phone](336) [phone]   [facility] [address], [place], [place] [phone], Ext. [phone] Mondays & Thursdays: 7-9 AM.  First 15 patients are seen on a first come, first serve basis.     Medicaid-accepting [facility] Providers:  Organization         Address  Phone   Notes  [facility] [address], [place] [phone]) [phone] Also accepts self-pay patients.  [facility] [address], [place]  [phone]) [phone]   [facility] [address], [place] [phone]) [phone]   [facility] [address] [place] [address], [place] [phone]) [phone]   Renaye RakersVeita Bland [address], [place]   [phone] Only accepts [place] Access [place] patients after they have their name applied to their card.   Self-Pay (no insurance) in [place] [facility]:  Organization         Address  Phone   Notes  Sickle Cell Patients, [facility] [address], [place] [phone]) [phone]   [facility] [address], [place] [phone]) [phone]   [facility]  [address], [place] [phone]   [facility]/Dr. Osei-Bonsu  [address] [place] [address], [place] or [phone], [place] [phone]) [phone] Phone number for both [place] and [place] locations is the same.  [facility] [address], [place] [phone]) [phone]   [facility] [place] [address] [place] [address], [place] or [address] [phone]) [phone]) [phone]   [facility] [address], [place] [phone]) [phone], phone; [phone], fax Sees patients 1st and 3rd Saturday of every month.  Must not qualify for public or private insurance (i.e. Medicaid, Medicare, [place] Health Choice, Veterans' Benefits)  Household income should be no more than 200% of the poverty level The clinic cannot treat you if you are pregnant or think you are pregnant  Sexually transmitted diseases are not treated at the clinic.    Dental Care: Organization         Address  Phone  Notes  [facility]  [facility] [address], [place] [phone]) [phone] Accepts children up to age 321 who are enrolled in [place] or [place] Health Choice; pregnant women with a Medicaid card; and children who have applied for Medicaid or [place] Health Choice, but were declined, whose parents can pay a reduced fee at time of service.  [facility] Department of [facility] [place]  [address], [place] [phone]) [phone] Accepts children up to age 58 who are enrolled in [place] or [place] Health Choice; pregnant women with a Medicaid card; and children who have applied for Medicaid or [place] Health Choice, but were declined, whose parents can pay a reduced fee at time of service.  [facility]  [address], [place] [phone]) [phone] Patients are seen by appointment only. Walk-ins are not accepted. [facility] will see  patients 40 years of age and older. Monday - Tuesday (8am-5pm) Most Wednesdays (8:30-5pm) $30 per visit, cash only  [facility] Access PROGRAM  [address], [facility] [phone] Patients are seen by appointment only. Walk-ins are not accepted. [facility] will see patients 62 years of age and older. One Wednesday Evening (Monthly: Volunteer Based).  $30 per visit, cash only  [employer]  [phone] for adults; Children under age 73, call Graduate Pediatric Dentistry at [phone]. Children aged 29-14, please call [phone] to request a pediatric application.  Dental services are provided in all areas of dental care including fillings, crowns and bridges, complete and partial dentures, implants, gum treatment, root canals, and extractions. Preventive care is also provided. Treatment is provided to both adults and children. Patients are selected via a lottery and there is often a waiting list.   [facility] [address] [place]  [phone]  [email or web address]   [facility] [address] [place], [place] [phone], Ext. [phone] Second and Fourth Thursday of each month, opens at 6:30 AM; Clinic ends at 9 AM.  Patients are seen on a first-come first-served basis, and a limited number are seen during each clinic.   [facility]  [address] [place], [place] [phone]   Eligibility Requirements You must have lived in [place], [place], or [place] counties for at least the last three months.   You cannot be eligible for state or federal sponsored [place], including [employer], [place], or [employer].   You generally cannot be eligible for healthcare insurance through your employer.    How to apply: Eligibility screenings are held every Tuesday and Wednesday afternoon from 1:00 pm until 4:00 pm. You do not need an appointment for the interview!  [facility] [address], [place], [place] [phone]   [facility] Health Department  [phone]   [facility] Health Department  [phone]   [facility] Health Department  [phone]    Behavioral Health Resources in the Community: Intensive Outpatient Programs Organization         Address  Phone  Notes  [facility] [address], [place], [place] [phone]   [facility] Outpatient [address], [place], [place] [phone]   [facility]: [facility] [address] [place], [place]  [phone]   [facility] Health [address],  [place], [place] [phone] or [phone]   Substance Abuse Resources Organization         Address  Phone  Notes  [facility] Drug Services  [phone]   [facility]  [phone]   [facility] [place]  [phone]   Floydene Flock  [phone]   Residential & Outpatient Substance Abuse Program  [phone]   Psychological Services Organization          Address  Phone  Notes  [facility]  [phone]   [facility]  [phone]   [facility] Mental Health [address], [place] [phone] or [phone]    Mobile Crisis Teams Organization         Address  Phone  Notes  [facility], Mobile Crisis Care Unit  [phone]   [facility]  [address] [place], [place] [phone]   [facility] [address] [place] [phone]    Self-Help/Support Groups Organization  Address  Phone             Notes  [place]. of [place] - variety of support groups  [place] Call for more information  Narcotics Anonymous (NA), [facility] [address], [employer] [place]  2 meetings at this location   Special educational needs teacher         Address  Phone  Notes  [facility] [place],    [place]  [phone]   [facility]  [address], [place] [address], [place], [place]   [place], [place] [phone] Admissions: 8am-3pm M-F  Incentives Substance [place] [address],    [place], [place] [phone]   [facility] [address] [place], [place], [place]   [facility] [address]  [place], [place]   [facility] - Intensive Outpatient [address], [place], [place]   [facility] ([place]  [place], [place] [phone] or [phone]   [facility] (RT[facility]) [address], [place], [place] Accepts Medicaid  [facility] [address]  [place] [phone] Substance Abuse/Addiction Treatment   [facility] Organization         Address  Phone  Notes  [facility]  [phone]   Domenic Schwab, PhD [address] [place], [place]   [phone] or [phone]   [place], [place] [phone]   [facility] [address], [place], [place] [phone] Insurance/Medicaid/sponsorship through [facility] and Families [address]                                    [place], [place] [phone] [place] [address] [place], [place] [phone]    Dr. Adele Schilder  [phone]   [facility]. 1) [address] [place] 2) [place] 3)  [employer], [place] [phone]  [phone]   [place] [phone] or [phone] (After Hours)

## 2015-07-28 NOTE — ED Provider Notes (Signed)
CSN: [id number]     Arrival date & time 07/28/15  1105 History   First MD Initiated Contact with Patient 07/28/15 1111     Chief Complaint  Patient presents with  . Jaw Pain    l/lower jaw    HPI   Logan Payne is a 59 y.o. male with a PMH of DM who presents to the ED with dental pain, which he states started this morning. He reports constant pain in his upper left jaw. He reports swallowing exacerbates his pain, though he is able to swallow and has been handling his secretions well. He has not tried anything for symptom relief. He denies fever, chills, significant swelling.    Past Medical History  Diagnosis Date  . Jaw pain    History reviewed. No pertinent past surgical history. Family History  Problem Relation Age of Onset  . Diabetes Mother    Social History  Substance Use Topics  . Smoking status: Current Every Day Smoker    Types: Cigarettes  . Smokeless tobacco: None  . Alcohol Use: No      Review of Systems  Constitutional: Negative for fever and chills.  HENT: Positive for dental problem. Negative for drooling and trouble swallowing.       Allergies  Review of patient's allergies indicates no known allergies.  Home Medications   Prior to Admission medications   Medication Sig Start Date End Date Taking? Authorizing Provider  amoxicillin (AMOXIL) 500 MG capsule Take 500 mg by mouth 3 (three) times daily. 10 day supply started on 10/07/2013    Historical Provider, MD  amoxicillin-clavulanate (AUGMENTIN) 875-125 MG tablet Take 1 tablet by mouth every 12 (twelve) hours. 07/28/15   Mady Gemma, PA-C  benzocaine (ORAJEL) 10 % mucosal gel Use as directed 1 application in the mouth or throat 3 (three) times daily as needed for mouth pain.    Historical Provider, MD  HYDROcodone-acetaminophen (NORCO/VICODIN) 5-325 MG tablet Take 2 tablets by mouth every 4 (four) hours as needed. 07/28/15   Mady Gemma, PA-C  ibuprofen (ADVIL,MOTRIN) 600 MG tablet Take  1 tablet (600 mg total) by mouth every 6 (six) hours as needed. 06/18/15   Joycie Peek, PA-C  oxyCODONE-acetaminophen (PERCOCET) 10-325 MG per tablet Take 0.5-1 tablets by mouth every 4 (four) hours as needed for pain. 10/15/13   Arthor Captain, PA-C  oxyCODONE-acetaminophen (PERCOCET/ROXICET) 5-325 MG per tablet Take 1 tablet by mouth every 4 (four) hours as needed for moderate pain or severe pain.    Historical Provider, MD    BP 129/78 mmHg  Pulse 70  Temp(Src) 98.3 F (36.8 C) (Oral)  Resp 20  SpO2 100% Physical Exam  Constitutional: He is oriented to person, place, and time. He appears well-developed and well-nourished. No distress.  HENT:  Head: Normocephalic and atraumatic.  Right Ear: External ear normal.  Left Ear: External ear normal.  Nose: Nose normal.  Mouth/Throat: Oropharynx is clear and moist and mucous membranes are normal. Abnormal dentition. Dental caries present. No dental abscesses. No oropharyngeal exudate, posterior oropharyngeal edema, posterior oropharyngeal erythema or tonsillar abscesses.    Eyes: Conjunctivae and EOM are normal. Pupils are equal, round, and reactive to light. Right eye exhibits no discharge. Left eye exhibits no discharge. No scleral icterus.  Neck: Normal range of motion. Neck supple.  Cardiovascular: Normal rate and regular rhythm.   Pulmonary/Chest: Effort normal and breath sounds normal. No respiratory distress.  Musculoskeletal: Normal range of motion. He exhibits no edema or tenderness.  Neurological: He is alert and oriented to person, place, and time.  Skin: Skin is warm and dry. He is not diaphoretic.  Psychiatric: He has a normal mood and affect. His behavior is normal.  Nursing note and vitals reviewed.   ED Course  Procedures (including critical care time)  Labs Review Labs Reviewed - No data to display  Imaging Review No results found.     EKG Interpretation None      MDM   Final diagnoses:  Pain, dental     59 year old male presents with dental pain, which started this morning. Denies fever, chills, significant swelling. States he is able to swallow and has been handling his secretions well. Patient is afebrile. Vital signs stable. TTP of gingiva over upper left 2nd molar, dentition absent. No erythema or edema. No dental abscess. No edema or TTP of floor of mouth to suggest Ludwig's. Posterior oropharynx without erythema, edema, or exudate. Lungs clear to auscultation bilaterally. No respiratory distress.  Pain controlled in the ED. Patient is non-toxic and well-appearing. Will discharge with short course of pain medication and antibiotic. Patient given resources for dental follow-up. Return precautions discussed. Patient verbalizes his understanding and is in agreement with plan.  BP 129/78 mmHg  Pulse 84  Temp(Src) 98.3 F (36.8 C) (Oral)  Resp 20  SpO2 99%     Mady Gemmalizabeth C Lillyona Polasek, PA-C 07/28/15 2246  Benjiman CoreNathan Pickering, MD 07/29/15 [phone]

## 2015-07-30 ENCOUNTER — Emergency Department (HOSPITAL_COMMUNITY)
Admission: EM | Admit: 2015-07-30 | Discharge: 2015-07-30 | Disposition: A | Discharge status: Home / Self Care | Payer: PRIVATE HEALTH INSURANCE | Attending: Emergency Medicine | Admitting: Emergency Medicine

## 2015-07-30 ENCOUNTER — Encounter (HOSPITAL_COMMUNITY): Payer: Self-pay | Admitting: *Deleted

## 2015-07-30 DIAGNOSIS — G8929 Other chronic pain: Secondary | ICD-10-CM

## 2015-07-30 DIAGNOSIS — K089 Disorder of teeth and supporting structures, unspecified: Secondary | ICD-10-CM

## 2015-07-30 DIAGNOSIS — Z720 Tobacco use: Secondary | ICD-10-CM | POA: Insufficient documentation

## 2015-07-30 DIAGNOSIS — K0889 Other specified disorders of teeth and supporting structures: Secondary | ICD-10-CM | POA: Insufficient documentation

## 2015-07-30 MED ORDER — OXYCODONE-ACETAMINOPHEN 5-325 MG PO TABS
2.0000 | ORAL_TABLET | Freq: Once | ORAL | Status: AC
  Administered 2015-07-30: 2 via ORAL
  Filled 2015-07-30: qty 2

## 2015-07-30 MED ORDER — IBUPROFEN 800 MG PO TABS: 800.0000 mg | ORAL_TABLET | Freq: Three times a day (TID) | ORAL | Status: AC

## 2015-07-30 NOTE — ED Provider Notes (Signed)
CSN: [id number]     Arrival date & time 07/30/15  1404 History  By signing my name below, I, Essence Howell, attest that this documentation has been prepared under the direction and in the presence of Catha GosselinHanna Patel-Mills, PA-C Electronically Signed: Charline BillsEssence Howell, ED Scribe 07/31/2015 at 3:29 PM.   Chief Complaint  Patient presents with  . Dental Pain   The history is provided by the patient. No language interpreter was used.   HPI Comments: Logan Payne is a 59 y.o. male who presents to the Emergency Department complaining of constant left-sided facial pain for the past 2 days. Pt was seen in the ED 2 days ago for the same; he was discharged with Augmentin, 6 Norco tablets and ibuprofen which he states has not provided any relief. Today, he presents with gradually worsening left-sided facial pain and upper dental pain that is exacerbated with swallowing and opening his mouth. He denies fever. This is his 6th visit for the same in the past 3 years. He states that he went to a specialist but can not be seen for a month.   Past Medical History  Diagnosis Date  . Jaw pain    History reviewed. No pertinent past surgical history. Family History  Problem Relation Age of Onset  . Diabetes Mother    Social History  Substance Use Topics  . Smoking status: Current Every Day Smoker    Types: Cigarettes  . Smokeless tobacco: None  . Alcohol Use: No    Review of Systems  Constitutional: Negative for fever.  HENT: Positive for dental problem.        +jaw pain   Allergies  Review of patient's allergies indicates no known allergies.  Home Medications   Prior to Admission medications   Medication Sig Start Date End Date Taking? Authorizing Provider  oxyCODONE-acetaminophen (PERCOCET/ROXICET) 5-325 MG per tablet Take 1 tablet by mouth every 4 (four) hours as needed for moderate pain or severe pain.   Yes Historical Provider, MD  amoxicillin-clavulanate (AUGMENTIN) 875-125 MG tablet Take 1  tablet by mouth every 12 (twelve) hours. Patient not taking: Reported on 07/30/2015 07/28/15   Mady GemmaElizabeth C [place], PA-C  HYDROcodone-acetaminophen (NORCO/VICODIN) 5-325 MG tablet Take 2 tablets by mouth every 4 (four) hours as needed. Patient not taking: Reported on 07/30/2015 07/28/15   Mady GemmaElizabeth C [place], PA-C  ibuprofen (ADVIL,MOTRIN) 800 MG tablet Take 1 tablet (800 mg total) by mouth 3 (three) times daily. 07/30/15   Antanisha Mohs Patel-Mills, PA-C  oxyCODONE-acetaminophen (PERCOCET) 10-325 MG per tablet Take 0.5-1 tablets by mouth every 4 (four) hours as needed for pain. Patient not taking: Reported on 07/30/2015 10/15/13   Arthor CaptainAbigail Harris, PA-C   BP 112/79 mmHg  Pulse 85  Temp(Src) 98.2 F (36.8 C) (Oral)  Resp 20  SpO2 99% Physical Exam  Constitutional: He is oriented to person, place, and time. He appears well-developed and well-nourished. No distress.  HENT:  Head: Normocephalic and atraumatic.  Mouth/Throat: No trismus in the jaw.  L upper dental pain but no gum swelling or facial swelling. Able to open and close his jaw without difficulty or any clicks. No trismus. No anterior cervical swelling.  Eyes: Conjunctivae and EOM are normal.  Neck: Neck supple. No tracheal deviation present.  Cardiovascular: Normal rate.   Pulmonary/Chest: Effort normal. No respiratory distress.  Musculoskeletal: Normal range of motion.  Neurological: He is alert and oriented to person, place, and time.  Skin: Skin is warm and dry.  Psychiatric: He has a normal  mood and affect. His behavior is normal.  Nursing note and vitals reviewed.  ED Course  Procedures (including critical care time) DIAGNOSTIC STUDIES: Oxygen Saturation is 99% on RA, normal by my interpretation.    COORDINATION OF CARE: 3:24 PM-Discussed treatment plan which includes Percocet and follow-up with specialist with pt at bedside and pt agreed to plan.   Labs Review Labs Reviewed - No data to display  Imaging Review No results  found.   EKG Interpretation None      MDM   Final diagnoses:  Chronic dental pain  Patient presents for chronic dental pain. He was seen here 2 days ago for the same. He was prescribed Augmentin and Percocet. He returns today for continued dental pain. He has no signs of dental infection or Ludwig's angina. I explained that this is chronic in nature and that I would not be giving him narcotic pain medication to go home with. I did agree to treat him while he was in the ED. I also gave the patient referral to a dentist. Filed Vitals:   07/30/15 1413  BP: 112/79  Pulse: 85  Temp: 98.2 F (36.8 C)  Resp: 20    I personally performed the services described in this documentation, which was scribed in my presence. The recorded information has been reviewed and is accurate.    Catha Gosselin, PA-C 07/31/15 1610  Laurence Spates, MD 08/01/15 [phone]

## 2015-07-30 NOTE — ED Notes (Signed)
Pt reports left side facial pain for extended amount of time, unsure if its dental pain or possible TMJ. Airway intact.

## 2015-07-30 NOTE — ED Notes (Signed)
Declined W/C at D/C and was escorted to lobby by RN. 

## 2016-10-05 ENCOUNTER — Emergency Department (HOSPITAL_COMMUNITY)
Admission: EM | Admit: 2016-10-05 | Discharge: 2016-10-05 | Disposition: A | Discharge status: Home / Self Care | Payer: PRIVATE HEALTH INSURANCE | Attending: Emergency Medicine | Admitting: Emergency Medicine

## 2016-10-05 ENCOUNTER — Encounter (HOSPITAL_COMMUNITY): Payer: Self-pay

## 2016-10-05 DIAGNOSIS — Z79899 Other long term (current) drug therapy: Secondary | ICD-10-CM | POA: Insufficient documentation

## 2016-10-05 DIAGNOSIS — R200 Anesthesia of skin: Secondary | ICD-10-CM | POA: Insufficient documentation

## 2016-10-05 DIAGNOSIS — F17210 Nicotine dependence, cigarettes, uncomplicated: Secondary | ICD-10-CM | POA: Insufficient documentation

## 2016-10-05 DIAGNOSIS — K047 Periapical abscess without sinus: Secondary | ICD-10-CM

## 2016-10-05 MED ORDER — CLINDAMYCIN HCL 150 MG PO CAPS: 300.0000 mg | ORAL_CAPSULE | Freq: Three times a day (TID) | ORAL | 0 refills | Status: AC

## 2016-10-05 NOTE — ED Triage Notes (Signed)
Pt presents with c/o lip swelling. Pt reported that approx 1-2 weeks ago, he noticed some swelling around the outer portion of his mouth. Pt reports he believed he had an abscess. Pt then took some penicillin that he had at home and his lip started to swell and become numb which started approx one week ago. NAD.

## 2016-10-05 NOTE — ED Notes (Signed)
Bed: WTR5 Expected date:  Expected time:  Means of arrival:  Comments: 

## 2016-10-05 NOTE — ED Provider Notes (Signed)
[facility]-EMERGENCY DEPT Provider Note   CSN: [id number] Arrival date & time: 10/05/16  1828     History   Chief Complaint Chief Complaint  Patient presents with  . Oral Swelling    HPI Logan Payne is a 61 y.o. male who presents to ED c/o R lower lip swelling and numbness x 1 wk. Pt states he had R dental pain 2 weeks ago and he took 3 pills of penicillin 1 week ago that he had from a prior prescription b/c he thought he had a dental abscess. Within 3 days of taking PCN, his dental pain resolved, but his R lower lip was left numb and swollen and it has not resolved or improved since then. Denies fever/chills, dysphagia, difficulty swallowing, tongue or throat swelling, neck swelling, altered sensation anywhere else, weight loss. Pt last seen in ED 07/2016 for chronic dental pain and d/c w/ Percocet w/ relief and he frequents ED often for similar complaint. Pt denies any drug or food allergies, taking any other medications and has a negative medical hx. +Tobacco 1 ppd. Pt doesn't have access to dental care and cannot recall why he was prescribed PCN.   HPI  Past Medical History:  Diagnosis Date  . Jaw pain     There are no active problems to display for this patient.   History reviewed. No pertinent surgical history.     Home Medications    Prior to Admission medications   Medication Sig Start Date End Date Taking? Authorizing Provider  amoxicillin-clavulanate (AUGMENTIN) 875-125 MG tablet Take 1 tablet by mouth every 12 (twelve) hours. Patient not taking: Reported on 07/30/2015 07/28/15   Mady GemmaElizabeth C Westfall, PA-C  HYDROcodone-acetaminophen (NORCO/VICODIN) 5-325 MG tablet Take 2 tablets by mouth every 4 (four) hours as needed. Patient not taking: Reported on 07/30/2015 07/28/15   Mady GemmaElizabeth C Westfall, PA-C  ibuprofen (ADVIL,MOTRIN) 800 MG tablet Take 1 tablet (800 mg total) by mouth 3 (three) times daily. 07/30/15   Hanna Patel-Mills, PA-C  oxyCODONE-acetaminophen (PERCOCET) 10-325  MG per tablet Take 0.5-1 tablets by mouth every 4 (four) hours as needed for pain. Patient not taking: Reported on 07/30/2015 10/15/13   Arthor CaptainAbigail Hannahmarie Asberry, PA-C  oxyCODONE-acetaminophen (PERCOCET/ROXICET) 5-325 MG per tablet Take 1 tablet by mouth every 4 (four) hours as needed for moderate pain or severe pain.    Historical Provider, MD    Family History Family History  Problem Relation Age of Onset  . Diabetes Mother     Social History Social History  Substance Use Topics  . Smoking status: Current Every Day Smoker    Types: Cigarettes  . Smokeless tobacco: Not on file  . Alcohol use No     Allergies   Patient has no known allergies.   Review of Systems Review of Systems  Constitutional: Negative for chills and fever.  HENT: Positive for dental problem. Negative for drooling, mouth sores, trouble swallowing and voice change.   Neurological: Positive for numbness. Negative for dizziness, seizures, facial asymmetry, speech difficulty, weakness and headaches.     Physical Exam Updated Vital Signs BP 114/77 (BP Location: Left Arm)   Pulse 68   Temp 97.8 F (36.6 C) (Oral)   Resp 20   Ht 5\' 4"  (1.626 m)   Wt 65.8 kg   SpO2 99%   BMI 24.89 kg/m   Physical Exam  Constitutional: He appears well-developed and well-nourished. No distress.  HENT:  Head: Normocephalic and atraumatic.    Poor dentition with multiple missing ad decayed  teeth. Oropharynx clear. No masses or lesions identified. Normal phonation and swallowing.  Eyes: Conjunctivae are normal. No scleral icterus.  Neck: Normal range of motion. Neck supple.  Cardiovascular: Normal rate, regular rhythm and normal heart sounds.   Pulmonary/Chest: Effort normal and breath sounds normal. No respiratory distress.  Abdominal: Soft. There is no tenderness.  Musculoskeletal: He exhibits no edema.  Neurological: He is alert.  CN without deficit  Skin: Skin is warm and dry. He is not diaphoretic.  Psychiatric: His  behavior is normal.  Nursing note and vitals reviewed.    ED Treatments / Results  Labs (all labs ordered are listed, but only abnormal results are displayed) Labs Reviewed - No data to display  EKG  EKG Interpretation None       Radiology No results found.  Procedures Procedures (including critical care time)  Medications Ordered in ED Medications - No data to display   Initial Impression / Assessment and Plan / ED Course  I have reviewed the triage vital signs and the nursing notes.  Pertinent labs & imaging results that were available during my care of the patient were reviewed by me and considered in my medical decision making (see chart for details).  Clinical Course     Patient with numbness in the submental region. No other neuro deficits. Discussed with Dr. Effie Shy who feels patient should take oral abx and follow with dentist. Return if sxs do not resolve or for new or worsening sxs.  No signs of ludwigs angina.   Final Clinical Impressions(s) / ED Diagnoses   Final diagnoses:  Dental infection  Lip numbness    New Prescriptions New Prescriptions   No medications on file     Arthor Captain, PA-C 10/05/16 2032    Mancel Bale, MD 10/06/16 [phone]

## 2016-10-05 NOTE — ED Notes (Signed)
Patient was alert, oriented and stable upon discharge. RN went over AVS and patient had no further questions.  

## 2016-10-05 NOTE — Discharge Instructions (Signed)
[employer]  °[employer]  °[facility]  °[address]  °[place], [place] [address]  °Phone [phone]  °[facility] [employer] [facility] [place], [place], exemplifies the Dental School’s vision to improve the health and quality of life of all [place]ians by creating leaders with a passion to care for the underserved and by leading the nation in community-based, service learning oral health education. °We are committed to offering comprehensive general dental services for adults, children and special needs patients in a safe, caring and professional setting. ° °Appointments: Our clinic is open Monday through Friday 8:00 a.m. until 5:00 p.m. The amount of time scheduled for an appointment depends on the patient’s specific needs. We ask that you keep your appointed time for care or provide 24-hour notice of all appointment changes. Parents or legal guardians must accompany minor children. ° °Payment for Services: Medicaid and other insurance plans are welcome. Payment for services is due when services are rendered and may be made by cash or credit card. If you have dental insurance, we will assist you with your claim submission.  ° °Emergencies: Emergency services will be provided Monday through Friday on a walk-in basis. Please arrive early for emergency services. After hours emergency services will be provided for patients of record as required. ° °Services:  °Comprehensive General Dentistry  °Children’s Dentistry  °Oral Surgery - Extractions  °Root Canals  °Sealants and Tooth Colored Fillings  °Crowns and Bridges  °Dentures and Partial Dentures  °Implant Services  °Periodontal Services and Cleanings  °Cosmetic Tooth Whitening  °Digital Radiography  °3-D/[facility] Beam Imaging ° ° °

## 2017-06-23 DIAGNOSIS — F17210 Nicotine dependence, cigarettes, uncomplicated: Secondary | ICD-10-CM | POA: Diagnosis not present

## 2017-06-23 DIAGNOSIS — Z79899 Other long term (current) drug therapy: Secondary | ICD-10-CM | POA: Insufficient documentation

## 2017-06-23 DIAGNOSIS — K0889 Other specified disorders of teeth and supporting structures: Secondary | ICD-10-CM | POA: Diagnosis present

## 2017-06-23 DIAGNOSIS — Z9889 Other specified postprocedural states: Secondary | ICD-10-CM | POA: Insufficient documentation

## 2017-06-24 ENCOUNTER — Emergency Department (HOSPITAL_COMMUNITY)
Admission: EM | Admit: 2017-06-24 | Discharge: 2017-06-24 | Disposition: A | Discharge status: Home / Self Care | Payer: No Typology Code available for payment source | Attending: Emergency Medicine | Admitting: Emergency Medicine

## 2017-06-24 ENCOUNTER — Encounter (HOSPITAL_COMMUNITY): Payer: Self-pay

## 2017-06-24 DIAGNOSIS — K0889 Other specified disorders of teeth and supporting structures: Secondary | ICD-10-CM

## 2017-06-24 DIAGNOSIS — Z9889 Other specified postprocedural states: Secondary | ICD-10-CM

## 2017-06-24 MED ORDER — BUPIVACAINE-EPINEPHRINE (PF) 0.5% -1:200000 IJ SOLN
1.8000 mL | Freq: Once | INTRAMUSCULAR | Status: AC
  Administered 2017-06-24: 1.8 mL
  Filled 2017-06-24: qty 1.8

## 2017-06-24 MED ORDER — LIDOCAINE VISCOUS 2 % MT SOLN: 15.0000 mL | OROMUCOSAL | 0 refills | Status: AC | PRN

## 2017-06-24 MED ORDER — OXYCODONE-ACETAMINOPHEN 5-325 MG PO TABS
1.0000 | ORAL_TABLET | ORAL | Status: DC | PRN
  Administered 2017-06-24: 1 via ORAL
  Filled 2017-06-24: qty 1

## 2017-06-24 NOTE — Discharge Instructions (Signed)
Please call Dr. Anson [place] office tomorrow morning to schedule a follow-up appointment. Please continue to take all of your penicillin antibiotics. You can apply cool compresses to the left cheek up to 3-4 times a day to help with pain. You can swish and spit 15 mL of lidocaine every 3 hours as needed for pain. Please do not use this medication more than once every 3 hours.   If you develop worsening symptoms, including thick, white pus-like drainage from the area, fever, or chills, please return to the emergency department for reevaluation.

## 2017-06-24 NOTE — ED Triage Notes (Signed)
Pt had a tooth pulled yesterday and tonight his left jaw is swollen and he has pain

## 2017-06-24 NOTE — ED Provider Notes (Signed)
[facility] Provider Note   CSN: [id number] Arrival date & time: 06/23/17  2333     History   Chief Complaint Chief Complaint  Patient presents with  . Dental Pain    HPI Logan Payne is a 61 y.o. male who presents to the emergency department with a chief complaint of left upper dental pain that began yesterday after the patient had a tooth pulled. He reports associated left swelling. He denies fever, chills, trismus, drooling, difficulty breathing or swallowing. or drainage from the area. He reports he's continue to take the penicillin that he was prescribed after the procedure. He has also taken ibuprofen without relief.  The history is provided by the patient. No language interpreter was used.    Past Medical History:  Diagnosis Date  . Jaw pain     There are no active problems to display for this patient.   History reviewed. No pertinent surgical history.     Home Medications    Prior to Admission medications   Medication Sig Start Date End Date Taking? Authorizing Provider  amoxicillin-clavulanate (AUGMENTIN) 875-125 MG tablet Take 1 tablet by mouth every 12 (twelve) hours. Patient not taking: Reported on 07/30/2015 07/28/15   Mady Gemma, PA-C  clindamycin (CLEOCIN) 150 MG capsule Take 2 capsules (300 mg total) by mouth 3 (three) times daily. 10/05/16   Arthor Captain, PA-C  HYDROcodone-acetaminophen (NORCO/VICODIN) 5-325 MG tablet Take 2 tablets by mouth every 4 (four) hours as needed. Patient not taking: Reported on 07/30/2015 07/28/15   Mady Gemma, PA-C  ibuprofen (ADVIL,MOTRIN) 800 MG tablet Take 1 tablet (800 mg total) by mouth 3 (three) times daily. 07/30/15   Patel-Mills, Lorelle [place], PA-C  lidocaine (XYLOCAINE) 2 % solution Use as directed 15 mLs in the mouth or throat every 3 (three) hours as needed for mouth pain. 06/24/17   Cadarius Nevares A, PA-C  oxyCODONE-acetaminophen (PERCOCET) 10-325 MG per tablet Take 0.5-1 tablets by mouth every 4  (four) hours as needed for pain. Patient not taking: Reported on 07/30/2015 10/15/13   Arthor Captain, PA-C  oxyCODONE-acetaminophen (PERCOCET/ROXICET) 5-325 MG per tablet Take 1 tablet by mouth every 4 (four) hours as needed for moderate pain or severe pain.    [provider]    Family History Family History  Problem Relation Age of Onset  . Diabetes Mother     Social History Social History  Substance Use Topics  . Smoking status: Current Every Day Smoker    Types: Cigarettes  . Smokeless tobacco: Never Used  . Alcohol use No     Allergies   Patient has no known allergies.   Review of Systems Review of Systems  Constitutional: Negative for chills and fever.  HENT: Positive for dental problem and facial swelling.      Physical Exam Updated Vital Signs BP 130/73 (BP Location: Left Arm)   Pulse 97   Temp 98.7 F (37.1 C) (Oral)   Resp 18   Ht  (1.702 m)   Wt 65.8 kg (145 lb)   SpO2 100%   BMI 22.71 kg/m   Physical Exam  Constitutional: He appears well-developed.  HENT:  Head: Normocephalic.  Mouth/Throat: Uvula is midline, oropharynx is clear and moist and mucous membranes are normal. No oral lesions. No trismus in the jaw. Abnormal dentition. No dental abscesses or uvula swelling.  Numerous missing teeth. Tooth number 25 has been pull and the socket is empty. No erythema or edema to the surrounding gingiva. No ecchymosis or  signs of necrosis. The area does not appear infected. At this time, I do not appreciate any swelling to the left face. No trismus.  Eyes: Conjunctivae are normal.  Neck: Neck supple.  Cardiovascular: Normal rate and regular rhythm.   No murmur heard. Pulmonary/Chest: Effort normal.  Abdominal: Soft. He exhibits no distension.  Neurological: He is alert.  Skin: Skin is warm and dry.  Psychiatric: His behavior is normal.  Nursing note and vitals reviewed.    ED Treatments / Results  Labs (all labs ordered are listed, but  only abnormal results are displayed) Labs Reviewed - No data to display  EKG  EKG Interpretation None       Radiology No results found.  Procedures Dental Block Date/Time: 06/24/2017 5:07 AM Performed by: Lilian Kapur, Carnella Fryman A Authorized by: Frederik Pear A   Consent:    Consent obtained:  Verbal   Consent given by:  Patient   Risks discussed:  Infection, nerve damage, unsuccessful block, hematoma, swelling and pain   Alternatives discussed:  No treatment Indications:    Indications: post-dental procedure pain   Location:    Block type:  Posterior superior alveolar Procedure details (see MAR for exact dosages):    Needle gauge:  27 G   Anesthetic injected:  Bupivacaine 0.5% WITH epi   Injection procedure:  Anatomic landmarks identified, anatomic landmarks palpated, negative aspiration for blood, introduced needle and incremental injection Post-procedure details:    Outcome:  Pain improved   Patient tolerance of procedure:  Tolerated well, no immediate complications   (including critical care time)  Medications Ordered in ED Medications  oxyCODONE-acetaminophen (PERCOCET/ROXICET) 5-325 MG per tablet 1 tablet (1 tablet Oral Given 06/24/17 0318)  bupivacaine-epinephrine (MARCAINE W/ EPI) 0.5% -1:200000 injection 1.8 mL (1.8 mLs Infiltration Given 06/24/17 0521)     Initial Impression / Assessment and Plan / ED Course  I have reviewed the triage vital signs and the nursing notes.  Pertinent labs & imaging results that were available during my care of the patient were reviewed by me and considered in my medical decision making (see chart for details).     Patient with dental pain after having a tooth pulled yesterday.  No gross abscess.  No signs of cellulitis. Exam unconcerning for Ludwig's angina or spread of infection. Dental block performed. He reports improvement of his pain. The patient is already taking penicillin, and I continued him to finish this course of antibiotics.  Encouraged him to continue to take ibuprofen and call Dr. Anson [place] office to schedule a follow up appointment. Strict return precautions given. No acute distress. The patient is safe for discharge at this time.   Final Clinical Impressions(s) / ED Diagnoses   Final diagnoses:  History of recent dental procedure  Pain, dental    New Prescriptions Discharge Medication List as of 06/24/2017  5:18 AM    START taking these medications   Details  lidocaine (XYLOCAINE) 2 % solution Use as directed 15 mLs in the mouth or throat every 3 (three) hours as needed for mouth pain., Starting Wed 06/24/2017, Print         Amariz Flamenco A, PA-C 06/24/17 1610    Dione Booze, MD 06/24/17 [phone]

## 2017-06-24 NOTE — ED Notes (Signed)
Bed: [facility] Expected date:  Expected time:  Means of arrival:  Comments:
